# Patient Record
Sex: Female | Born: 1975 | Race: White | Hispanic: No | Marital: Single | Smoking: Current every day smoker
Health system: Southern US, Community
[De-identification: ages and names within clinical notes are randomized; demographics above are authoritative.]

## PROBLEM LIST (undated history)

## (undated) DIAGNOSIS — M5020 Other cervical disc displacement, unspecified cervical region: Secondary | ICD-10-CM

## (undated) DIAGNOSIS — F1210 Cannabis abuse, uncomplicated: Secondary | ICD-10-CM

## (undated) DIAGNOSIS — F1920 Other psychoactive substance dependence, uncomplicated: Secondary | ICD-10-CM

## (undated) DIAGNOSIS — F1110 Opioid abuse, uncomplicated: Secondary | ICD-10-CM

## (undated) DIAGNOSIS — N809 Endometriosis, unspecified: Secondary | ICD-10-CM

## (undated) HISTORY — PX: TONSILLECTOMY: SUR1361

## (undated) HISTORY — PX: APPENDECTOMY: SHX54

---

## 2000-09-16 ENCOUNTER — Emergency Department (HOSPITAL_COMMUNITY): Admission: EM | Admit: 2000-09-16 | Discharge: 2000-09-16 | Payer: Self-pay | Admitting: Emergency Medicine

## 2001-02-17 ENCOUNTER — Emergency Department (HOSPITAL_COMMUNITY): Admission: EM | Admit: 2001-02-17 | Discharge: 2001-02-17 | Payer: Self-pay | Admitting: Emergency Medicine

## 2001-04-17 ENCOUNTER — Other Ambulatory Visit: Admission: RE | Admit: 2001-04-17 | Discharge: 2001-04-17 | Payer: Self-pay | Admitting: *Deleted

## 2002-10-01 ENCOUNTER — Inpatient Hospital Stay (HOSPITAL_COMMUNITY): Admission: AD | Admit: 2002-10-01 | Discharge: 2002-10-01 | Payer: Self-pay | Admitting: *Deleted

## 2002-10-02 ENCOUNTER — Ambulatory Visit (HOSPITAL_COMMUNITY): Admission: RE | Admit: 2002-10-02 | Discharge: 2002-10-02 | Payer: Self-pay | Admitting: *Deleted

## 2002-10-02 ENCOUNTER — Encounter: Payer: Self-pay | Admitting: *Deleted

## 2002-10-12 ENCOUNTER — Encounter: Payer: Self-pay | Admitting: *Deleted

## 2002-10-12 ENCOUNTER — Inpatient Hospital Stay (HOSPITAL_COMMUNITY): Admission: RE | Admit: 2002-10-12 | Discharge: 2002-10-12 | Payer: Self-pay | Admitting: *Deleted

## 2002-12-30 ENCOUNTER — Other Ambulatory Visit: Admission: RE | Admit: 2002-12-30 | Discharge: 2002-12-30 | Payer: Self-pay | Admitting: Obstetrics and Gynecology

## 2003-05-27 ENCOUNTER — Inpatient Hospital Stay (HOSPITAL_COMMUNITY): Admission: AD | Admit: 2003-05-27 | Discharge: 2003-05-30 | Payer: Self-pay | Admitting: Obstetrics and Gynecology

## 2003-07-07 ENCOUNTER — Other Ambulatory Visit: Admission: RE | Admit: 2003-07-07 | Discharge: 2003-07-07 | Payer: Self-pay | Admitting: Obstetrics and Gynecology

## 2003-10-05 ENCOUNTER — Emergency Department (HOSPITAL_COMMUNITY): Admission: EM | Admit: 2003-10-05 | Discharge: 2003-10-05 | Payer: Self-pay | Admitting: *Deleted

## 2003-11-09 ENCOUNTER — Emergency Department (HOSPITAL_COMMUNITY): Admission: EM | Admit: 2003-11-09 | Discharge: 2003-11-09 | Payer: Self-pay | Admitting: Emergency Medicine

## 2003-12-07 ENCOUNTER — Encounter: Admission: RE | Admit: 2003-12-07 | Discharge: 2003-12-07 | Payer: Self-pay | Admitting: Family Medicine

## 2003-12-13 ENCOUNTER — Observation Stay (HOSPITAL_COMMUNITY): Admission: EM | Admit: 2003-12-13 | Discharge: 2003-12-14 | Payer: Self-pay | Admitting: Emergency Medicine

## 2003-12-13 ENCOUNTER — Encounter: Admission: RE | Admit: 2003-12-13 | Discharge: 2003-12-13 | Payer: Self-pay | Admitting: Sports Medicine

## 2003-12-13 ENCOUNTER — Encounter: Admission: RE | Admit: 2003-12-13 | Discharge: 2003-12-13 | Payer: Self-pay | Admitting: Family Medicine

## 2004-02-08 ENCOUNTER — Encounter: Admission: RE | Admit: 2004-02-08 | Discharge: 2004-02-08 | Payer: Self-pay | Admitting: Family Medicine

## 2004-03-29 ENCOUNTER — Ambulatory Visit (HOSPITAL_COMMUNITY): Admission: RE | Admit: 2004-03-29 | Discharge: 2004-03-29 | Payer: Self-pay | Admitting: Obstetrics and Gynecology

## 2004-04-18 ENCOUNTER — Encounter: Admission: RE | Admit: 2004-04-18 | Discharge: 2004-04-18 | Payer: Self-pay | Admitting: Family Medicine

## 2004-05-04 ENCOUNTER — Encounter: Admission: RE | Admit: 2004-05-04 | Discharge: 2004-05-04 | Payer: Self-pay | Admitting: Family Medicine

## 2004-05-05 ENCOUNTER — Inpatient Hospital Stay (HOSPITAL_COMMUNITY): Admission: AD | Admit: 2004-05-05 | Discharge: 2004-05-05 | Payer: Self-pay | Admitting: Obstetrics and Gynecology

## 2004-06-13 ENCOUNTER — Encounter: Admission: RE | Admit: 2004-06-13 | Discharge: 2004-06-13 | Payer: Self-pay | Admitting: Family Medicine

## 2004-06-27 ENCOUNTER — Encounter: Admission: RE | Admit: 2004-06-27 | Discharge: 2004-06-27 | Payer: Self-pay | Admitting: Family Medicine

## 2004-07-04 ENCOUNTER — Emergency Department (HOSPITAL_COMMUNITY): Admission: EM | Admit: 2004-07-04 | Discharge: 2004-07-05 | Payer: Self-pay | Admitting: Emergency Medicine

## 2004-07-08 ENCOUNTER — Inpatient Hospital Stay (HOSPITAL_COMMUNITY): Admission: AD | Admit: 2004-07-08 | Discharge: 2004-07-08 | Payer: Self-pay | Admitting: Gynecology

## 2004-07-20 ENCOUNTER — Encounter: Admission: RE | Admit: 2004-07-20 | Discharge: 2004-07-20 | Payer: Self-pay | Admitting: Family Medicine

## 2004-07-25 ENCOUNTER — Encounter: Admission: RE | Admit: 2004-07-25 | Discharge: 2004-07-25 | Payer: Self-pay | Admitting: Family Medicine

## 2007-02-27 DIAGNOSIS — F411 Generalized anxiety disorder: Secondary | ICD-10-CM | POA: Insufficient documentation

## 2007-02-27 DIAGNOSIS — F339 Major depressive disorder, recurrent, unspecified: Secondary | ICD-10-CM | POA: Insufficient documentation

## 2016-02-15 ENCOUNTER — Other Ambulatory Visit: Payer: Self-pay | Admitting: Specialist

## 2016-02-15 DIAGNOSIS — M5412 Radiculopathy, cervical region: Secondary | ICD-10-CM

## 2016-02-23 ENCOUNTER — Ambulatory Visit
Admission: RE | Admit: 2016-02-23 | Discharge: 2016-02-23 | Disposition: A | Discharge status: Home / Self Care | Payer: Medicaid Other | Source: Ambulatory Visit | Attending: Specialist | Admitting: Specialist

## 2016-02-23 DIAGNOSIS — M5412 Radiculopathy, cervical region: Secondary | ICD-10-CM

## 2016-09-26 ENCOUNTER — Emergency Department (HOSPITAL_BASED_OUTPATIENT_CLINIC_OR_DEPARTMENT_OTHER)
Admission: EM | Admit: 2016-09-26 | Discharge: 2016-09-26 | Disposition: A | Discharge status: Home / Self Care | Payer: Medicaid Other | Attending: Emergency Medicine | Admitting: Emergency Medicine

## 2016-09-26 ENCOUNTER — Emergency Department (HOSPITAL_BASED_OUTPATIENT_CLINIC_OR_DEPARTMENT_OTHER): Payer: Medicaid Other

## 2016-09-26 ENCOUNTER — Encounter (HOSPITAL_BASED_OUTPATIENT_CLINIC_OR_DEPARTMENT_OTHER): Payer: Self-pay | Admitting: Emergency Medicine

## 2016-09-26 DIAGNOSIS — F513 Sleepwalking [somnambulism]: Secondary | ICD-10-CM | POA: Insufficient documentation

## 2016-09-26 DIAGNOSIS — Z79899 Other long term (current) drug therapy: Secondary | ICD-10-CM | POA: Diagnosis not present

## 2016-09-26 DIAGNOSIS — Y92002 Bathroom of unspecified non-institutional (private) residence single-family (private) house as the place of occurrence of the external cause: Secondary | ICD-10-CM | POA: Diagnosis not present

## 2016-09-26 DIAGNOSIS — W19XXXA Unspecified fall, initial encounter: Secondary | ICD-10-CM

## 2016-09-26 DIAGNOSIS — S0990XA Unspecified injury of head, initial encounter: Secondary | ICD-10-CM | POA: Diagnosis present

## 2016-09-26 DIAGNOSIS — W1839XA Other fall on same level, initial encounter: Secondary | ICD-10-CM | POA: Diagnosis not present

## 2016-09-26 DIAGNOSIS — S01111A Laceration without foreign body of right eyelid and periocular area, initial encounter: Secondary | ICD-10-CM | POA: Insufficient documentation

## 2016-09-26 DIAGNOSIS — F17210 Nicotine dependence, cigarettes, uncomplicated: Secondary | ICD-10-CM | POA: Diagnosis not present

## 2016-09-26 DIAGNOSIS — Y939 Activity, unspecified: Secondary | ICD-10-CM | POA: Insufficient documentation

## 2016-09-26 DIAGNOSIS — Y999 Unspecified external cause status: Secondary | ICD-10-CM | POA: Insufficient documentation

## 2016-09-26 DIAGNOSIS — S0181XA Laceration without foreign body of other part of head, initial encounter: Secondary | ICD-10-CM

## 2016-09-26 HISTORY — DX: Other cervical disc displacement, unspecified cervical region: M50.20

## 2016-09-26 HISTORY — DX: Endometriosis, unspecified: N80.9

## 2016-09-26 LAB — PREGNANCY, URINE: Preg Test, Ur: NEGATIVE

## 2016-09-26 NOTE — ED Provider Notes (Addendum)
[facility] Provider Note   CSN: [id number] Arrival date & time: 09/26/16  0932     History   Chief Complaint Chief Complaint  Patient presents with  . Laceration    HPI Martha Gomez is a 40 y.o. female.  HPI  Patient presents after mechanical fall earlier this morning. She reports that she has been having trouble with sleep walking for the past several weeks to months and is recently started on new medication, Topamax and amitriptyline. Since then Topamax has been discontinued however she continues to have sleepwalking issues. Patient also noted to be on Soma, Lexapro, and Phenergan. She reports that she awoke around 3:00 while sitting on the toilet. After trying to get up she noted that her legs had fallen asleep and had trouble maintaining her stance causing her to fall forward and hitting her head on the floor. She denies LOC or amnesia to the event. She noted bleeding from above the right eyebrow. She decided to wait to see if she had any complications. She did not sleep after the incident. Since then the bleeding has resolved. She has not had any trouble walking or is having any focal deficits since. She does complain of headache. Otherwise no complaints.  Past Medical History:  Diagnosis Date  . Endometriosis   . Herniated disc, cervical     Patient Active Problem List   Diagnosis Date Noted  . DEPRESSION, MAJOR, RECURRENT 02/27/2007  . ANXIETY 02/27/2007    Past Surgical History:  Procedure Laterality Date  . APPENDECTOMY    . TONSILLECTOMY      OB History    No data available       Home Medications    Prior to Admission medications   Medication Sig Start Date End Date Taking? Authorizing Provider  carisoprodol (SOMA) 350 MG tablet Take 350 mg by mouth 3 (three) times daily.   Yes Historical Provider, MD  Coenzyme Q10 (CO Q-10) 200 MG CAPS Take 1 tablet by mouth daily.   Yes Historical Provider, MD  escitalopram (LEXAPRO) 10 MG tablet Take  10 mg by mouth daily.   Yes Historical Provider, MD  gabapentin (NEURONTIN) 300 MG capsule Take 300 mg by mouth 3 (three) times daily.   Yes Historical Provider, MD  linaclotide (LINZESS) 290 MCG CAPS capsule Take 290 mcg by mouth daily before breakfast.   Yes Historical Provider, MD  Magnesium 400 MG CAPS Take 400 mg by mouth daily.   Yes Historical Provider, MD  Multiple Vitamin (MULTIVITAMIN) tablet Take 1 tablet by mouth daily.   Yes Historical Provider, MD  promethazine (PHENERGAN) 25 MG tablet Take 25 mg by mouth every 6 (six) hours as needed for nausea or vomiting.   Yes Historical Provider, MD  RABEprazole (ACIPHEX) 20 MG tablet Take 20 mg by mouth 2 (two) times daily.   Yes Historical Provider, MD  vitamin C (ASCORBIC ACID) 500 MG tablet Take 500 mg by mouth daily.   Yes Historical Provider, MD    Family History No family history on file.  Social History Social History  Substance Use Topics  . Smoking status: Current Every Day Smoker    Packs/day: 1.00    Types: Cigarettes  . Smokeless tobacco: Never Used  . Alcohol use No     Allergies   Celebrex [celecoxib]; Cymbalta [duloxetine hcl]; Erythromycin; Penicillins; and Sulfa antibiotics   Review of Systems Review of Systems  Constitutional: Negative for chills and fever.  HENT: Negative for ear pain and  sore throat.   Eyes: Negative for pain and visual disturbance.  Respiratory: Negative for cough and shortness of breath.   Cardiovascular: Negative for chest pain and palpitations.  Gastrointestinal: Negative for abdominal pain and vomiting.  Genitourinary: Negative for dysuria and hematuria.  Musculoskeletal: Negative for arthralgias and back pain.  Skin: Positive for wound. Negative for color change and rash.  Neurological: Positive for headaches. Negative for seizures and syncope.  All other systems reviewed and are negative.    Physical Exam Updated Vital Signs BP 135/84 (BP Location: Right Arm)   Pulse 70    Temp 98.4 F (36.9 C) (Oral)   Resp 18   Ht 5\' 6"  (1.676 m)   Wt 155 lb (70.3 kg)   LMP 08/22/2016   SpO2 96%   BMI 25.02 kg/m   Physical Exam  Constitutional: She is oriented to person, place, and time. She appears well-developed and well-nourished. No distress.  HENT:  Head: Normocephalic. Head is with laceration (1 cm superficial, over right eyebrow).  Right Ear: External ear normal.  Left Ear: External ear normal.  Nose: Nose normal.  Eyes: Conjunctivae and EOM are normal. Pupils are equal, round, and reactive to light. Right eye exhibits no discharge. Left eye exhibits no discharge. No scleral icterus.  Neck: Normal range of motion. Neck supple. Spinous process tenderness (about C6) present.    Cardiovascular: Normal rate, regular rhythm and normal heart sounds.  Exam reveals no gallop and no friction rub.   No murmur heard. Pulses:      Radial pulses are 2+ on the right side, and 2+ on the left side.       Dorsalis pedis pulses are 2+ on the right side, and 2+ on the left side.  Pulmonary/Chest: Effort normal and breath sounds normal. No stridor. No respiratory distress. She has no wheezes. She has no rales.  Abdominal: Soft. She exhibits no distension. There is no tenderness.  Musculoskeletal: She exhibits no edema or tenderness.       Cervical back: She exhibits no bony tenderness.       Thoracic back: She exhibits no bony tenderness.       Lumbar back: She exhibits no bony tenderness.  Clavicles stable. Chest stable to AP/Lat compression. Pelvis stable to Lat compression. No obvious extremity deformity. No seat belt sign.  Neurological: She is alert and oriented to person, place, and time.  Mental Status: Alert and oriented to person, place, and time. Attention and concentration normal. Speech clear. Recent memory is intac  Cranial Nerves  II Visual Fields: Intact to confrontation. Visual fields intact. III, IV, VI: Pupils equal and reactive to light and near. Full  eye movement without nystagmus  V Facial Sensation: Normal. No weakness of masticatory muscles  VII: No facial weakness or asymmetry  VIII Auditory Acuity: Grossly normal  IX/X: The uvula is midline; the palate elevates symmetrically  XI: Normal sternocleidomastoid and trapezius strength  XII: The tongue is midline. No atrophy or fasciculations.   Motor System: Muscle Strength: 5/5 and symmetric in the upper and lower extremities. No pronation or drift.  Muscle Tone: Tone and muscle bulk are normal in the upper and lower extremities.   Reflexes: DTRs: 2+ and symmetrical in all four extremities. Plantar responses are flexor bilaterally.  Coordination: Intact finger-to-nose, heel-to-shin. No tremor.  Sensation: Intact to light touch, and pinprick.  Gait: Routine and tandem gait are normal    Skin: Skin is warm and dry. No rash noted. She is  not diaphoretic. No erythema.  Psychiatric: She has a normal mood and affect.  Vitals reviewed.    ED Treatments / Results  Labs (all labs ordered are listed, but only abnormal results are displayed) Labs Reviewed  PREGNANCY, URINE    EKG  EKG Interpretation  Date/Time:  Wednesday September 26 2016 10:31:28 EDT Ventricular Rate:  68 PR Interval:    QRS Duration: 99 QT Interval:  416 QTC Calculation: 443 R Axis:   34 Text Interpretation:  Sinus or ectopic atrial rhythm Short PR interval Low voltage, extremity and precordial leads no prior tracing for comparison Confirmed by [facility] MD, Abbagale Goguen ([id number]) on 09/26/2016 11:21:42 AM       Radiology Ct Head Wo Contrast  Result Date: 09/26/2016 CLINICAL DATA:  Trauma. Laceration to right eyebrow region. Right temporal bruising. EXAM: CT HEAD WITHOUT CONTRAST CT CERVICAL SPINE WITHOUT CONTRAST TECHNIQUE: Multidetector CT imaging of the head and cervical spine was performed following the standard protocol without intravenous contrast. Multiplanar CT image reconstructions of the cervical spine were  also generated. COMPARISON:  Cervical spine MRI of 02/23/2016 FINDINGS: CT HEAD FINDINGS Brain: No mass lesion, hemorrhage, hydrocephalus, acute infarct, intra-axial, or extra-axial fluid collection. Vascular: No hyperdense vessel or unexpected calcification. Skull: Soft tissue injury superior and lateral to the right orbit, including on image 11/series 3. Soft tissue swelling extends minimally into the right temporal region. No other scalp soft tissue swelling. No skull fracture. Sinuses/Orbits: Normal orbits and globes. Clear paranasal sinuses and mastoid air cells. Other: None CT CERVICAL SPINE FINDINGS Alignment: Spinal visualization through the bottom of T1. Maintenance of vertebral body height. Straightening of expected cervical lordosis. Facets are well-aligned. Skull base and vertebrae: Skull base intact. No acute fracture. Coronal reformats demonstrate a normal C1-C2 articulation. Soft tissues and spinal canal: Prevertebral soft tissues are within normal limits. No epidural hematoma. Disc levels: No significant neural foraminal narrowing or central canal stenosis. Upper chest: No apical pneumothorax. Other: None IMPRESSION: 1. Right supraorbital and temporal scalp soft tissue swelling, without acute intracranial abnormality. 2. No acute fracture or subluxation in the cervical spine. Straightening of expected cervical lordosis could be positional, due to muscular spasm, or ligamentous injury. Electronically Signed   By: Jeronimo Greaves M.D.   On: 09/26/2016 10:58   Ct Cervical Spine Wo Contrast  Result Date: 09/26/2016 CLINICAL DATA:  Trauma. Laceration to right eyebrow region. Right temporal bruising. EXAM: CT HEAD WITHOUT CONTRAST CT CERVICAL SPINE WITHOUT CONTRAST TECHNIQUE: Multidetector CT imaging of the head and cervical spine was performed following the standard protocol without intravenous contrast. Multiplanar CT image reconstructions of the cervical spine were also generated. COMPARISON:  Cervical  spine MRI of 02/23/2016 FINDINGS: CT HEAD FINDINGS Brain: No mass lesion, hemorrhage, hydrocephalus, acute infarct, intra-axial, or extra-axial fluid collection. Vascular: No hyperdense vessel or unexpected calcification. Skull: Soft tissue injury superior and lateral to the right orbit, including on image 11/series 3. Soft tissue swelling extends minimally into the right temporal region. No other scalp soft tissue swelling. No skull fracture. Sinuses/Orbits: Normal orbits and globes. Clear paranasal sinuses and mastoid air cells. Other: None CT CERVICAL SPINE FINDINGS Alignment: Spinal visualization through the bottom of T1. Maintenance of vertebral body height. Straightening of expected cervical lordosis. Facets are well-aligned. Skull base and vertebrae: Skull base intact. No acute fracture. Coronal reformats demonstrate a normal C1-C2 articulation. Soft tissues and spinal canal: Prevertebral soft tissues are within normal limits. No epidural hematoma. Disc levels: No significant neural foraminal narrowing or central canal  stenosis. Upper chest: No apical pneumothorax. Other: None IMPRESSION: 1. Right supraorbital and temporal scalp soft tissue swelling, without acute intracranial abnormality. 2. No acute fracture or subluxation in the cervical spine. Straightening of expected cervical lordosis could be positional, due to muscular spasm, or ligamentous injury. Electronically Signed   By: Jeronimo Greaves M.D.   On: 09/26/2016 10:58    Procedures Procedures (including critical care time)  Medications Ordered in ED Medications - No data to display   Initial Impression / Assessment and Plan / ED Course  I have reviewed the triage vital signs and the nursing notes.  Pertinent labs & imaging results that were available during my care of the patient were reviewed by me and considered in my medical decision making (see chart for details).  Clinical Course    Unable to clear his spine using Nexus criteria.  Obtain CT head and cervical spine which were unremarkable. Obtained EKG and given reported a regimen of amitriptyline. EKG without evidence of prolonged QT or QRS. Did note ectopic atrial rhythm. Otherwise no arrhythmias noted. Laceration is superficial requiring no stitches. Wound was irrigated and Steri-Strip applied.  Patient was instructed to follow-up with her neurologist in giving her persistent sleepwalking.  The patient is safe for discharge with strict return precautions.   Final Clinical Impressions(s) / ED Diagnoses   Final diagnoses:  Facial laceration, initial encounter  Fall, initial encounter  Head injury, initial encounter  Sleep walking   Disposition: Discharge  Condition: Good  I have discussed the results, Dx and Tx plan with the patient who expressed understanding and agree(s) with the plan. Discharge instructions discussed at great length. The patient was given strict return precautions who verbalized understanding of the instructions. No further questions at time of discharge.    Current Discharge Medication List      Follow Up: Neurologist  Schedule an appointment as soon as possible for a visit  as soon as possible for evaluation of your sleepwalking      Nira Conn, MD 09/26/16 1122

## 2016-09-26 NOTE — ED Notes (Signed)
Wound irrigated with NS and dried with 4x4 and steri strips applied. Tolerated well.

## 2016-09-26 NOTE — ED Triage Notes (Signed)
Pt reports waking up on toilet (she does not remember getting up to use BR) about 3am; when she stood up her legs were asleep and she fell, hitting head on tile floor.

## 2017-01-30 ENCOUNTER — Emergency Department (HOSPITAL_BASED_OUTPATIENT_CLINIC_OR_DEPARTMENT_OTHER): Payer: Medicaid Other

## 2017-01-30 ENCOUNTER — Encounter (HOSPITAL_BASED_OUTPATIENT_CLINIC_OR_DEPARTMENT_OTHER): Payer: Self-pay

## 2017-01-30 ENCOUNTER — Emergency Department (HOSPITAL_BASED_OUTPATIENT_CLINIC_OR_DEPARTMENT_OTHER)
Admission: EM | Admit: 2017-01-30 | Discharge: 2017-01-30 | Disposition: A | Discharge status: Home / Self Care | Payer: Medicaid Other | Attending: Emergency Medicine | Admitting: Emergency Medicine

## 2017-01-30 DIAGNOSIS — Y999 Unspecified external cause status: Secondary | ICD-10-CM | POA: Diagnosis not present

## 2017-01-30 DIAGNOSIS — Y92410 Unspecified street and highway as the place of occurrence of the external cause: Secondary | ICD-10-CM | POA: Insufficient documentation

## 2017-01-30 DIAGNOSIS — Z5181 Encounter for therapeutic drug level monitoring: Secondary | ICD-10-CM | POA: Insufficient documentation

## 2017-01-30 DIAGNOSIS — Y939 Activity, unspecified: Secondary | ICD-10-CM | POA: Diagnosis not present

## 2017-01-30 DIAGNOSIS — S7002XA Contusion of left hip, initial encounter: Secondary | ICD-10-CM | POA: Diagnosis not present

## 2017-01-30 DIAGNOSIS — R109 Unspecified abdominal pain: Secondary | ICD-10-CM | POA: Insufficient documentation

## 2017-01-30 DIAGNOSIS — F1910 Other psychoactive substance abuse, uncomplicated: Secondary | ICD-10-CM | POA: Insufficient documentation

## 2017-01-30 DIAGNOSIS — F17210 Nicotine dependence, cigarettes, uncomplicated: Secondary | ICD-10-CM | POA: Diagnosis not present

## 2017-01-30 DIAGNOSIS — R079 Chest pain, unspecified: Secondary | ICD-10-CM | POA: Diagnosis not present

## 2017-01-30 DIAGNOSIS — S60511A Abrasion of right hand, initial encounter: Secondary | ICD-10-CM | POA: Diagnosis not present

## 2017-01-30 DIAGNOSIS — S60512A Abrasion of left hand, initial encounter: Secondary | ICD-10-CM | POA: Diagnosis not present

## 2017-01-30 DIAGNOSIS — S0181XA Laceration without foreign body of other part of head, initial encounter: Secondary | ICD-10-CM | POA: Diagnosis not present

## 2017-01-30 DIAGNOSIS — S0990XA Unspecified injury of head, initial encounter: Secondary | ICD-10-CM | POA: Diagnosis present

## 2017-01-30 DIAGNOSIS — W19XXXA Unspecified fall, initial encounter: Secondary | ICD-10-CM

## 2017-01-30 DIAGNOSIS — N390 Urinary tract infection, site not specified: Secondary | ICD-10-CM | POA: Insufficient documentation

## 2017-01-30 DIAGNOSIS — S0083XA Contusion of other part of head, initial encounter: Secondary | ICD-10-CM

## 2017-01-30 HISTORY — DX: Opioid abuse, uncomplicated: F11.10

## 2017-01-30 HISTORY — DX: Other psychoactive substance dependence, uncomplicated: F19.20

## 2017-01-30 HISTORY — DX: Cannabis abuse, uncomplicated: F12.10

## 2017-01-30 LAB — COMPREHENSIVE METABOLIC PANEL
ALT: 19 U/L (ref 14–54)
ANION GAP: 7 (ref 5–15)
AST: 43 U/L — ABNORMAL HIGH (ref 15–41)
Albumin: 3.4 g/dL — ABNORMAL LOW (ref 3.5–5.0)
Alkaline Phosphatase: 104 U/L (ref 38–126)
BUN: 8 mg/dL (ref 6–20)
CHLORIDE: 106 mmol/L (ref 101–111)
CO2: 26 mmol/L (ref 22–32)
Calcium: 8.7 mg/dL — ABNORMAL LOW (ref 8.9–10.3)
Creatinine, Ser: 0.55 mg/dL (ref 0.44–1.00)
GFR calc non Af Amer: >60 mL/min (ref >60)
Glucose, Bld: 89 mg/dL (ref 65–99)
Potassium: 3.8 mmol/L (ref 3.5–5.1)
SODIUM: 139 mmol/L (ref 135–145)
Total Bilirubin: 0.7 mg/dL (ref 0.3–1.2)
Total Protein: 6.3 g/dL — ABNORMAL LOW (ref 6.5–8.1)

## 2017-01-30 LAB — CBC WITH DIFFERENTIAL/PLATELET
BASOS PCT: 0 %
Basophils Absolute: 0 10*3/uL (ref 0.0–0.1)
EOS ABS: 0 10*3/uL (ref 0.0–0.7)
EOS PCT: 0 %
HCT: 37.8 % (ref 36.0–46.0)
Hemoglobin: 13 g/dL (ref 12.0–15.0)
LYMPHS ABS: 1.1 10*3/uL (ref 0.7–4.0)
Lymphocytes Relative: 8 %
MCH: 33.6 pg (ref 26.0–34.0)
MCHC: 34.4 g/dL (ref 30.0–36.0)
MCV: 97.7 fL (ref 78.0–100.0)
Monocytes Absolute: 0.9 10*3/uL (ref 0.1–1.0)
Monocytes Relative: 7 %
Neutro Abs: 11.8 10*3/uL — ABNORMAL HIGH (ref 1.7–7.7)
Neutrophils Relative %: 85 %
PLATELETS: 378 10*3/uL (ref 150–400)
RBC: 3.87 MIL/uL (ref 3.87–5.11)
RDW: 11.2 % — ABNORMAL LOW (ref 11.5–15.5)
WBC: 13.8 10*3/uL — AB (ref 4.0–10.5)

## 2017-01-30 LAB — URINALYSIS, ROUTINE W REFLEX MICROSCOPIC
Bilirubin Urine: NEGATIVE
GLUCOSE, UA: NEGATIVE mg/dL
Ketones, ur: 40 mg/dL — AB
Nitrite: NEGATIVE
PH: 6 (ref 5.0–8.0)
Protein, ur: NEGATIVE mg/dL
Specific Gravity, Urine: 1.017 (ref 1.005–1.030)

## 2017-01-30 LAB — RAPID URINE DRUG SCREEN, HOSP PERFORMED
Amphetamines: NOT DETECTED
Barbiturates: NOT DETECTED
Benzodiazepines: POSITIVE — AB
COCAINE: NOT DETECTED
OPIATES: POSITIVE — AB
Tetrahydrocannabinol: POSITIVE — AB

## 2017-01-30 LAB — URINALYSIS, MICROSCOPIC (REFLEX)

## 2017-01-30 LAB — SALICYLATE LEVEL: Salicylate Lvl: <7 mg/dL (ref 2.8–30.0)

## 2017-01-30 LAB — CK: Total CK: 786 U/L — ABNORMAL HIGH (ref 38–234)

## 2017-01-30 LAB — PREGNANCY, URINE: Preg Test, Ur: NEGATIVE

## 2017-01-30 LAB — ETHANOL

## 2017-01-30 LAB — ACETAMINOPHEN LEVEL

## 2017-01-30 MED ORDER — LIDOCAINE HCL (PF) 1 % IJ SOLN
5.0000 mL | Freq: Once | INTRAMUSCULAR | Status: AC
  Administered 2017-01-30: 5 mL
  Filled 2017-01-30: qty 5

## 2017-01-30 MED ORDER — SODIUM CHLORIDE 0.9 % IV BOLUS (SEPSIS)
1000.0000 mL | Freq: Once | INTRAVENOUS | Status: AC
  Administered 2017-01-30: 1000 mL via INTRAVENOUS

## 2017-01-30 MED ORDER — CEPHALEXIN 500 MG PO CAPS
500.0000 mg | ORAL_CAPSULE | Freq: Four times a day (QID) | ORAL | 0 refills | Status: AC
Start: 2017-01-30 — End: ?

## 2017-01-30 MED ORDER — IOPAMIDOL (ISOVUE-300) INJECTION 61%
100.0000 mL | Freq: Once | INTRAVENOUS | Status: AC | PRN
Start: 2017-01-30 — End: 2017-01-30
  Administered 2017-01-30: 100 mL via INTRAVENOUS

## 2017-01-30 MED FILL — CEPHALEXIN 500 MG CAPSULE: 500 | 5 days supply | Qty: 20 | Fill #0

## 2017-01-30 MED FILL — IBUPROFEN 800 MG TABLET: 800 | 5 days supply | Qty: 15 | Fill #0

## 2017-01-30 NOTE — ED Notes (Signed)
Pt taking po fluids well.  Ambulated to bathroom with standby assist.  Pt tolerated well.

## 2017-01-30 NOTE — ED Triage Notes (Signed)
[facility] with no ride home so she has been walking, states has tripped multiple times, walked here with active bleeding to chin and lip and rt knee.

## 2017-01-30 NOTE — Discharge Instructions (Addendum)
It was our pleasure to provide your ER care today - we hope that you feel better.  Do not use drugs.  See resource guide provided for community resources you can use to help.  Follow up in 1 week for suture removal with your doctor or urgent care.   Take antibiotic as prescribed for possible urine infection.  Return to the ED if you develop new or worsening symptoms.
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
## 2017-02-05 ENCOUNTER — Emergency Department (HOSPITAL_BASED_OUTPATIENT_CLINIC_OR_DEPARTMENT_OTHER)
Admission: EM | Admit: 2017-02-05 | Discharge: 2017-02-05 | Discharge status: Against Medical Advice | Payer: Medicaid Other | Attending: Emergency Medicine | Admitting: Emergency Medicine

## 2017-02-05 ENCOUNTER — Encounter (HOSPITAL_BASED_OUTPATIENT_CLINIC_OR_DEPARTMENT_OTHER): Payer: Self-pay

## 2017-02-05 DIAGNOSIS — F17210 Nicotine dependence, cigarettes, uncomplicated: Secondary | ICD-10-CM | POA: Diagnosis not present

## 2017-02-05 DIAGNOSIS — Z4802 Encounter for removal of sutures: Secondary | ICD-10-CM | POA: Insufficient documentation

## 2017-02-05 DIAGNOSIS — Z79899 Other long term (current) drug therapy: Secondary | ICD-10-CM | POA: Insufficient documentation

## 2017-02-05 NOTE — ED Notes (Signed)
Pt left prior to seeing provider

## 2017-02-05 NOTE — ED Notes (Signed)
Pt's mother has asked how much longer it will be twice now; sts they are "antsy" because they have an appt they need to make.  3 sutures removed from chin; wound well-healed and well-approximated.

## 2017-02-05 NOTE — ED Provider Notes (Signed)
[facility]GENCY DEPT [facility] Provider Note   CSN: [id number] Arrival date & time: 02/05/17  1132     History   Chief Complaint Chief Complaint  Patient presents with  . Suture / Staple Removal    HPI Martha Gomez is a 41 y.o. female.  HPI   41 year old female presenting to the ED today for removal of stitches. Pt injured his chin on 1/31, and had 3 sutures to chin.  It has been healing appropriately.  No complaint.  Past Medical History:  Diagnosis Date  . Drug abuse and dependence (HCC)   . Endometriosis   . Herniated disc, cervical   . Marijuana abuse   . Opiate abuse, continuous     Patient Active Problem List   Diagnosis Date Noted  . DEPRESSION, MAJOR, RECURRENT 02/27/2007  . ANXIETY 02/27/2007    Past Surgical History:  Procedure Laterality Date  . APPENDECTOMY    . TONSILLECTOMY      OB History    No data available       Home Medications    Prior to Admission medications   Medication Sig Start Date End Date Taking? Authorizing Provider  carisoprodol (SOMA) 350 MG tablet Take 350 mg by mouth 3 (three) times daily.    Historical Provider, MD  cephALEXin (KEFLEX) 500 MG capsule Take 1 capsule (500 mg total) by mouth 4 (four) times daily. 01/30/17   Cathren LaineKevin Steinl, MD  Coenzyme Q10 (CO Q-10) 200 MG CAPS Take 1 tablet by mouth daily.    Historical Provider, MD  escitalopram (LEXAPRO) 10 MG tablet Take 10 mg by mouth daily.    Historical Provider, MD  gabapentin (NEURONTIN) 300 MG capsule Take 300 mg by mouth 3 (three) times daily.    Historical Provider, MD  linaclotide (LINZESS) 290 MCG CAPS capsule Take 290 mcg by mouth daily before breakfast.    Historical Provider, MD  Magnesium 400 MG CAPS Take 400 mg by mouth daily.    Historical Provider, MD  Multiple Vitamin (MULTIVITAMIN) tablet Take 1 tablet by mouth daily.    Historical Provider, MD  promethazine (PHENERGAN) 25 MG tablet Take 25 mg by mouth every 6 (six) hours as needed for nausea or vomiting.     Historical Provider, MD  RABEprazole (ACIPHEX) 20 MG tablet Take 20 mg by mouth 2 (two) times daily.    Historical Provider, MD  vitamin C (ASCORBIC ACID) 500 MG tablet Take 500 mg by mouth daily.    Historical Provider, MD    Family History No family history on file.  Social History Social History  Substance Use Topics  . Smoking status: Current Every Day Smoker    Packs/day: 1.00    Types: Cigarettes  . Smokeless tobacco: Never Used  . Alcohol use No     Allergies   Celebrex [celecoxib]; Cymbalta [duloxetine hcl]; Erythromycin; Penicillins; and Sulfa antibiotics   Review of Systems Review of Systems  Constitutional: Negative for fever.  Skin: Positive for wound.  Neurological: Negative for numbness.     Physical Exam Updated Vital Signs BP 126/89 (BP Location: Left Arm)   Pulse 91   Temp 98.3 F (36.8 C) (Oral)   Resp 18   Ht 5\' 6"  (1.676 m)   Wt 59.6 kg   SpO2 98%   BMI 21.19 kg/m   Physical Exam  Constitutional: She appears well-developed and well-nourished. No distress.  HENT:  Head: Atraumatic.  Eyes: Conjunctivae are normal.  Neck: Neck supple.  Neurological: She is alert.  Skin: No rash noted.  A well healing lac to chin with 3 sutures in place.  No evidence of infection.   Psychiatric: She has a normal mood and affect.  Nursing note and vitals reviewed.    ED Treatments / Results  Labs (all labs ordered are listed, but only abnormal results are displayed) Labs Reviewed - No data to display  EKG  EKG Interpretation None       Radiology No results found.  Procedures Procedures (including critical care time)  SUTURE REMOVAL Performed by: Fayrene Helper  Consent: Verbal consent obtained. Patient identity confirmed: provided demographic data Time out: Immediately prior to procedure a "time out" was called to verify the correct patient, procedure, equipment, support staff and site/side marked as required.  Location details:  chin  Wound Appearance: clean  Sutures/Staples Removed: 3 sutures removed by our nurse.  Facility: sutures placed in this facility Patient tolerance: Patient tolerated the procedure well with no immediate complications.     Medications Ordered in ED Medications - No data to display   Initial Impression / Assessment and Plan / ED Course  I have reviewed the triage vital signs and the nursing notes.  Pertinent labs & imaging results that were available during my care of the patient were reviewed by me and considered in my medical decision making (see chart for details).     BP 126/89 (BP Location: Left Arm)   Pulse 91   Temp 98.3 F (36.8 C) (Oral)   Resp 18   Ht 5\' 6"  (1.676 m)   Wt 59.6 kg   SpO2 98%   BMI 21.19 kg/m    Final Clinical Impressions(s) / ED Diagnoses   Final diagnoses:  Visit for suture removal    New Prescriptions New Prescriptions   No medications on file     Fayrene Helper, PA-C 02/05/17 1312    Jerelyn Scott, MD 02/05/17 1322

## 2017-02-05 NOTE — ED Triage Notes (Signed)
For suture removal-sutures to chin 1/31-NAD-steady gait

## 2017-04-23 ENCOUNTER — Encounter (HOSPITAL_BASED_OUTPATIENT_CLINIC_OR_DEPARTMENT_OTHER): Payer: Self-pay | Admitting: Emergency Medicine

## 2017-04-23 ENCOUNTER — Emergency Department (HOSPITAL_BASED_OUTPATIENT_CLINIC_OR_DEPARTMENT_OTHER)
Admission: EM | Admit: 2017-04-23 | Discharge: 2017-04-23 | Disposition: A | Discharge status: Home / Self Care | Payer: Medicaid Other | Attending: Emergency Medicine | Admitting: Emergency Medicine

## 2017-04-23 DIAGNOSIS — Y999 Unspecified external cause status: Secondary | ICD-10-CM | POA: Insufficient documentation

## 2017-04-23 DIAGNOSIS — S01451A Open bite of right cheek and temporomandibular area, initial encounter: Secondary | ICD-10-CM | POA: Diagnosis present

## 2017-04-23 DIAGNOSIS — Z23 Encounter for immunization: Secondary | ICD-10-CM | POA: Diagnosis not present

## 2017-04-23 DIAGNOSIS — Y939 Activity, unspecified: Secondary | ICD-10-CM | POA: Insufficient documentation

## 2017-04-23 DIAGNOSIS — F17210 Nicotine dependence, cigarettes, uncomplicated: Secondary | ICD-10-CM | POA: Diagnosis not present

## 2017-04-23 DIAGNOSIS — S01431A Puncture wound without foreign body of right cheek and temporomandibular area, initial encounter: Secondary | ICD-10-CM | POA: Diagnosis not present

## 2017-04-23 DIAGNOSIS — Z79899 Other long term (current) drug therapy: Secondary | ICD-10-CM | POA: Insufficient documentation

## 2017-04-23 DIAGNOSIS — Y929 Unspecified place or not applicable: Secondary | ICD-10-CM | POA: Insufficient documentation

## 2017-04-23 DIAGNOSIS — W540XXA Bitten by dog, initial encounter: Secondary | ICD-10-CM | POA: Diagnosis not present

## 2017-04-23 MED ORDER — METRONIDAZOLE 500 MG PO TABS: 500.0000 mg | ORAL_TABLET | Freq: Three times a day (TID) | ORAL | 0 refills | Status: AC

## 2017-04-23 MED ORDER — DOXYCYCLINE HYCLATE 100 MG PO CAPS: 100.0000 mg | ORAL_CAPSULE | Freq: Two times a day (BID) | ORAL | 0 refills | Status: DC

## 2017-04-23 MED ORDER — TETANUS-DIPHTH-ACELL PERTUSSIS 5-2.5-18.5 LF-MCG/0.5 IM SUSP
0.5000 mL | Freq: Once | INTRAMUSCULAR | Status: AC
  Administered 2017-04-23: 0.5 mL via INTRAMUSCULAR
  Filled 2017-04-23: qty 0.5

## 2017-04-23 MED ORDER — CEFUROXIME AXETIL 500 MG PO TABS: 500.0000 mg | ORAL_TABLET | Freq: Two times a day (BID) | ORAL | 0 refills | Status: AC

## 2017-04-23 MED ORDER — MUPIROCIN 2 % EX OINT: 1.0000 "application " | TOPICAL_OINTMENT | Freq: Two times a day (BID) | CUTANEOUS | 0 refills | Status: DC

## 2017-04-23 MED ORDER — MUPIROCIN 2 % EX OINT: TOPICAL_OINTMENT | CUTANEOUS | 0 refills | Status: AC

## 2017-04-23 NOTE — ED Provider Notes (Signed)
[facility]ENCY [facility]PT [facility] Provider Note   CSN: [id number] Arrival date & time: 04/23/17  1643   By signing my name below, I, [name], attest that this documentation has been prepared under the direction and in the presence of [facility], PA-C. Electronically signed, [name], ED Scribe. 04/23/17. 5:28 PM.   History   Chief Complaint Chief Complaint  Patient presents with  . Animal Bite   The history is provided by the patient and medical records. No language interpreter was used.    Martha Gomez is a 41 y.o. female with h/o polysubstance abuse transported via private vehicle to the Emergency Department with concern for multiple dog bite wounds to her face. Pt states she startled her great dane while she was asleep. Wounds noted to forehead and R cheek. Bleeding controlled with bandaids. She states she cleaned the wounds out with water. She states the animal belongs to her, vaccinations are UTD. No pain or other injuries or any other complaints at this time. Last known tetanus 2004 or 2005.   Past Medical History:  Diagnosis Date  . Drug abuse and dependence (HCC)   . Endometriosis   . Herniated disc, cervical   . Marijuana abuse   . Opiate abuse, continuous     Patient Active Problem List   Diagnosis Date Noted  . DEPRESSION, MAJOR, RECURRENT 02/27/2007  . ANXIETY 02/27/2007    Past Surgical History:  Procedure Laterality Date  . APPENDECTOMY    . TONSILLECTOMY      OB History    No data available       Home Medications    Prior to Admission medications   Medication Sig Start Date End Date Taking? Authorizing Provider  escitalopram (LEXAPRO) 10 MG tablet Take 10 mg by mouth daily.   Yes Historical Provider, MD  gabapentin (NEURONTIN) 300 MG capsule Take 300 mg by mouth 3 (three) times daily.   Yes Historical Provider, MD  RABEprazole (ACIPHEX) 20 MG tablet Take 20 mg by mouth 2 (two) times daily.   Yes Historical Provider, MD  topiramate (TOPAMAX)  50 MG tablet Take 75 mg by mouth 2 (two) times daily.   Yes Historical Provider, MD  carisoprodol (SOMA) 350 MG tablet Take 350 mg by mouth 3 (three) times daily.    Historical Provider, MD  cefUROXime (CEFTIN) 500 MG tablet Take 1 tablet (500 mg total) by mouth 2 (two) times daily with a meal. 04/23/17   Chase Picket Jahkari Maclin, PA-C  cephALEXin (KEFLEX) 500 MG capsule Take 1 capsule (500 mg total) by mouth 4 (four) times daily. 01/30/17   Cathren Laine, MD  Coenzyme Q10 (CO Q-10) 200 MG CAPS Take 1 tablet by mouth daily.    Historical Provider, MD  linaclotide (LINZESS) 290 MCG CAPS capsule Take 290 mcg by mouth daily before breakfast.    Historical Provider, MD  Magnesium 400 MG CAPS Take 400 mg by mouth daily.    Historical Provider, MD  metroNIDAZOLE (FLAGYL) 500 MG tablet Take 1 tablet (500 mg total) by mouth 3 (three) times daily. 04/23/17   Chase Picket Cherylynn Liszewski, PA-C  Multiple Vitamin (MULTIVITAMIN) tablet Take 1 tablet by mouth daily.    Historical Provider, MD  mupirocin ointment (BACTROBAN) 2 % Apply to affected area twice daily. 04/23/17   Chase Picket Geoffrey Hynes, PA-C  promethazine (PHENERGAN) 25 MG tablet Take 25 mg by mouth every 6 (six) hours as needed for nausea or vomiting.    Historical Provider, MD  vitamin C (ASCORBIC  ACID) 500 MG tablet Take 500 mg by mouth daily.    Historical Provider, MD    Family History No family history on file.  Social History Social History  Substance Use Topics  . Smoking status: Current Every Day Smoker    Packs/day: 1.00    Types: Cigarettes  . Smokeless tobacco: Never Used  . Alcohol use No     Allergies   Celebrex [celecoxib]; Cymbalta [duloxetine hcl]; Erythromycin; Nsaids; Penicillins; and Sulfa antibiotics   Review of Systems Review of Systems  HENT: Negative for facial swelling.   Skin: Positive for wound.  Neurological: Negative for weakness and numbness.     Physical Exam Updated Vital Signs BP 110/83 (BP Location: Left Arm)   Pulse  78   Temp 98.2 F (36.8 C) (Oral)   Resp 16   Ht  (1.676 m)   Wt 143 lb (64.9 kg)   LMP 04/09/2017   SpO2 98%   BMI 23.08 kg/m   Physical Exam  Constitutional: She is oriented to person, place, and time. She appears well-developed and well-nourished. No distress.  HENT:  Head: Normocephalic.  Cardiovascular: Normal rate, regular rhythm and normal heart sounds.   No murmur heard. Pulmonary/Chest: Effort normal and breath sounds normal. No respiratory distress.  Abdominal: Soft. She exhibits no distension. There is no tenderness.  Musculoskeletal: She exhibits no edema.  Neurological: She is alert and oriented to person, place, and time.  Skin: Skin is warm and dry. Abrasion and laceration noted.  2 cm linear abrasion to the forehead and .5 cm puncture to R cheek.  Nursing note and vitals reviewed.    ED Treatments / Results  DIAGNOSTIC STUDIES: Oxygen Saturation is 98% on RA, NL by my interpretation.    COORDINATION OF CARE: 5:25 PM-Discussed next steps with pt. Pt verbalized understanding and is agreeable with the plan. Will order abx and prepare for d/c. Pt prepared for d/c, advised of symptomatic care at home and return precautions.   Labs (all labs ordered are listed, but only abnormal results are displayed) Labs Reviewed - No data to display  EKG  EKG Interpretation None       Radiology No results found.  Procedures Procedures (including critical care time)  Medications Ordered in ED Medications  Tdap (BOOSTRIX) injection 0.5 mL (0.5 mLs Intramuscular Given 04/23/17 1743)     Initial Impression / Assessment and Plan / ED Course  I have reviewed the triage vital signs and the nursing notes.  Pertinent labs & imaging results that were available during my care of the patient were reviewed by me and considered in my medical decision making (see chart for details).    Martha Gomez is a 41 y.o. female who presents to ED for dog bite. Wounds minimal  with bleeding controlled. Irrigated and dressed in ED. Home wound care instructions and prophylactic ABX discussed. Signs of infection / return precautions discussed. Tetanus updated. All questions answered.   Final Clinical Impressions(s) / ED Diagnoses   Final diagnoses:  Dog bite, initial encounter    New Prescriptions New Prescriptions   CEFUROXIME (CEFTIN) 500 MG TABLET    Take 1 tablet (500 mg total) by mouth 2 (two) times daily with a meal.   METRONIDAZOLE (FLAGYL) 500 MG TABLET    Take 1 tablet (500 mg total) by mouth 3 (three) times daily.   MUPIROCIN OINTMENT (BACTROBAN) 2 %    Apply to affected area twice daily.   I personally performed the  services described in this documentation, which was scribed in my presence. The recorded information has been reviewed and is accurate.    [facility] Madina Galati, PA-C 04/23/17 1758    Tilden Fossa, MD 04/23/17 [phone]

## 2017-04-23 NOTE — ED Triage Notes (Signed)
Dog bite to face by own dog. Pt has a place to forehead and to R cheek, bleeding controlled. Animals shots UTD.

## 2017-04-23 NOTE — Discharge Instructions (Signed)
It was my pleasure taking care of you today!   Keep wound clean with mild soap and water. Keep area covered with a topical antibiotic ointment and bandage. Ice for additional pain relief. Ibuprofen or Tylenol for additional pain relief. Monitor area for signs of infection to include, but not limited to: increasing pain, spreading redness, drainage/pus, worsening swelling, or fevers. Return to emergency department for emergent changing or worsening symptoms.  Please take all of your antibiotics until finished!

## 2017-06-09 IMAGING — DX DG KNEE COMPLETE 4+V*L*
4 series · 4 of 4 positions shown · non-contrast
Comparison: None.

CLINICAL DATA: Left knee pain.  Multiple falls this evening.

EXAM:
LEFT KNEE - COMPLETE 4+ VIEW

[knee ap]
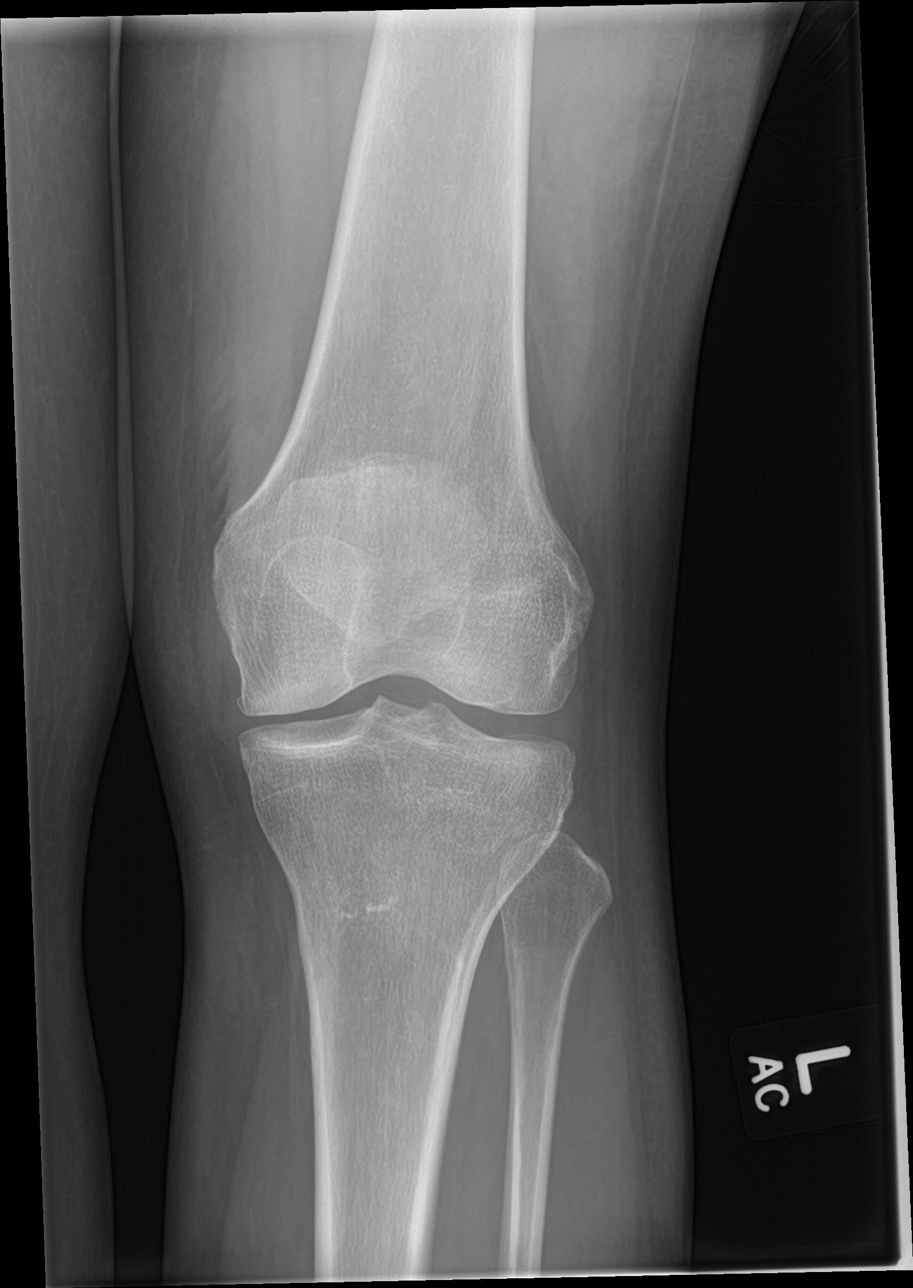

[knee lat]
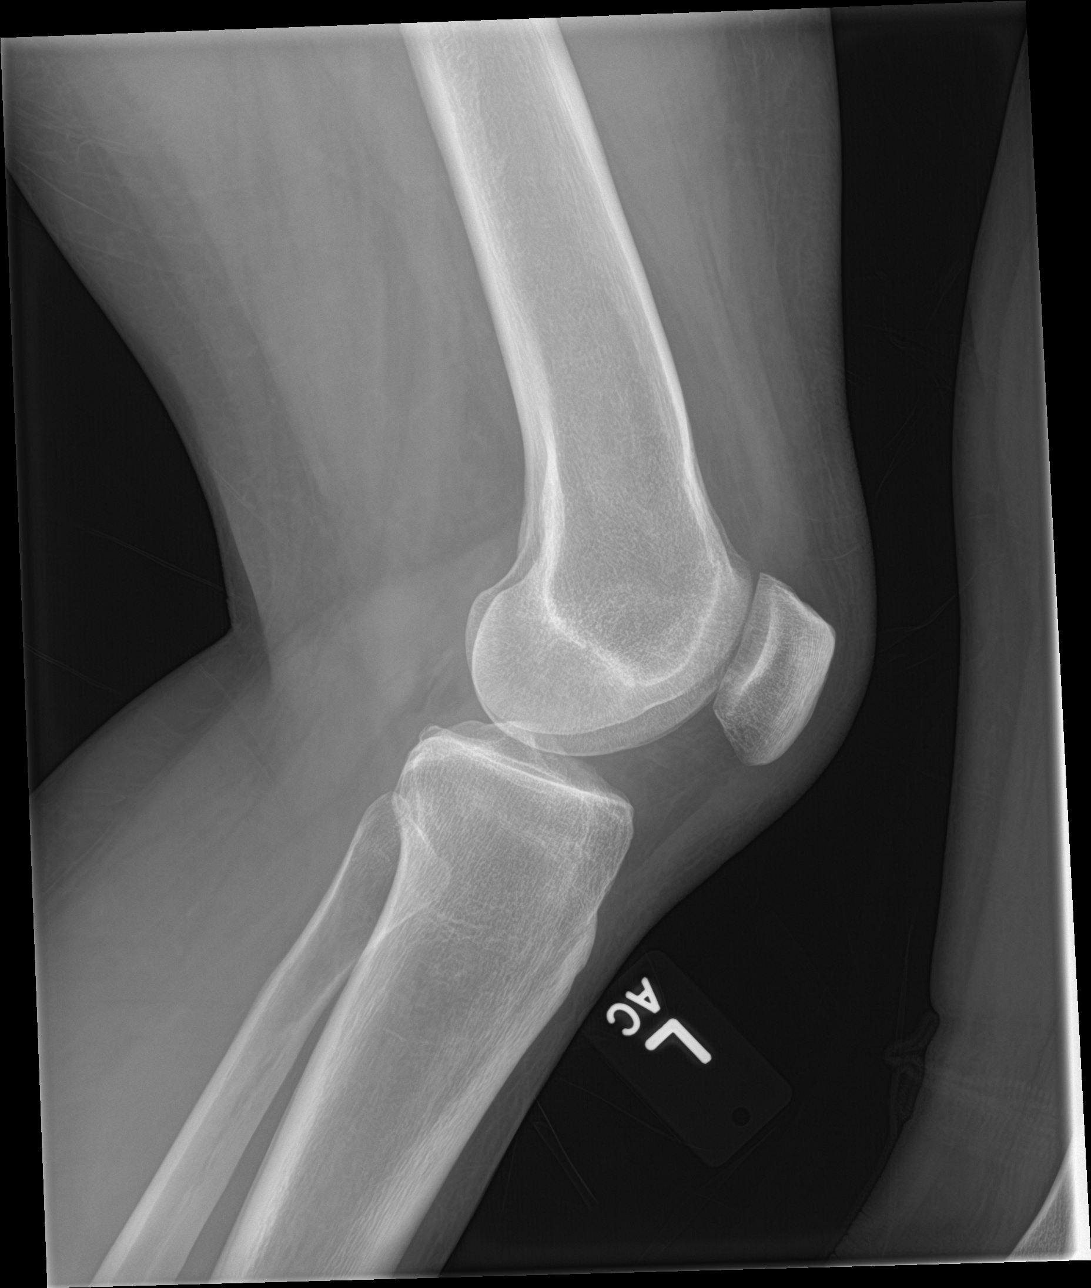

[knee obl (1 of 2)]
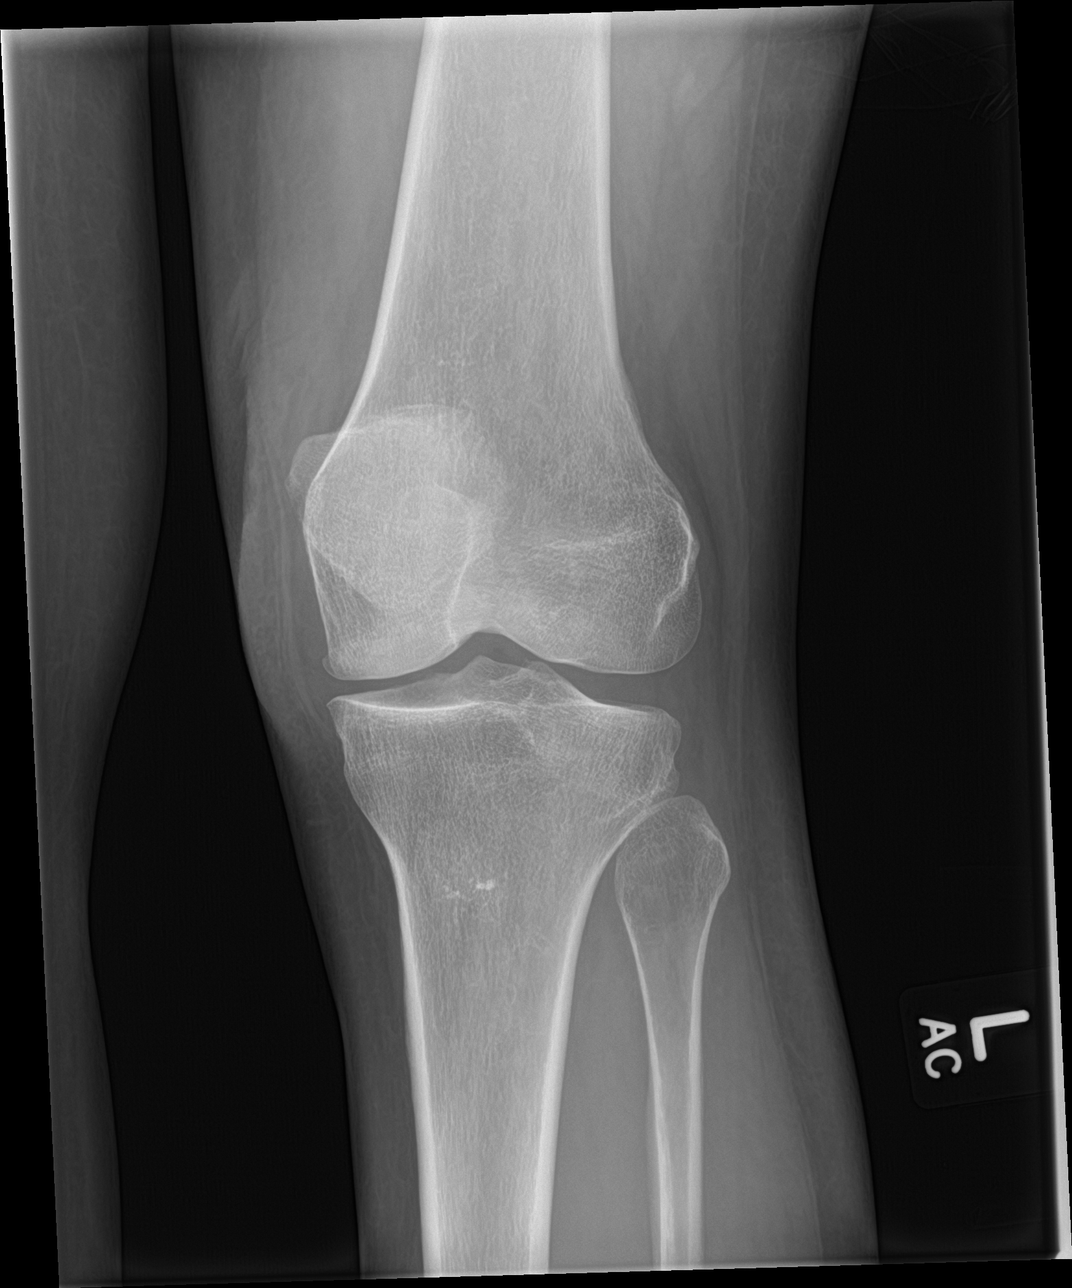

[knee obl (2 of 2)]
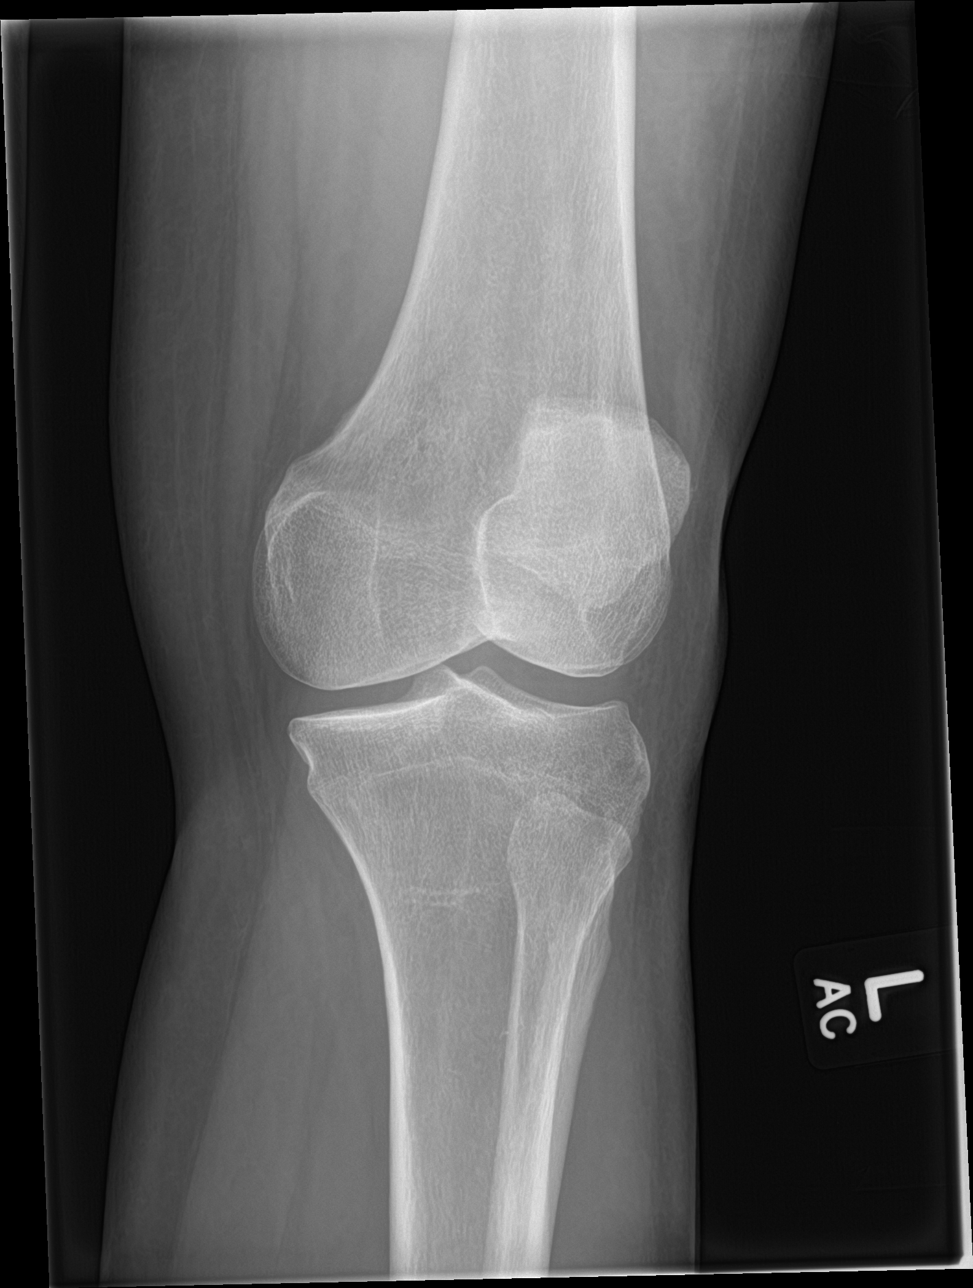

[4 of 4 positions shown; findings below may reference images not displayed]

FINDINGS: No evidence of fracture, dislocation, or joint effusion. Mild
peripheral spurring of the medial tibiofemoral compartment with
preservation of joint space. Soft tissues are unremarkable.
IMPRESSION: No fracture or subluxation of the left knee.

## 2018-06-02 ENCOUNTER — Encounter (HOSPITAL_BASED_OUTPATIENT_CLINIC_OR_DEPARTMENT_OTHER): Payer: Self-pay | Admitting: *Deleted

## 2018-06-02 ENCOUNTER — Other Ambulatory Visit: Payer: Self-pay

## 2018-06-02 ENCOUNTER — Emergency Department (HOSPITAL_BASED_OUTPATIENT_CLINIC_OR_DEPARTMENT_OTHER)
Admission: EM | Admit: 2018-06-02 | Discharge: 2018-06-02 | Disposition: A | Discharge status: Home / Self Care | Payer: Medicaid Other | Attending: Emergency Medicine | Admitting: Emergency Medicine

## 2018-06-02 DIAGNOSIS — Z79899 Other long term (current) drug therapy: Secondary | ICD-10-CM | POA: Diagnosis not present

## 2018-06-02 DIAGNOSIS — R21 Rash and other nonspecific skin eruption: Secondary | ICD-10-CM | POA: Diagnosis present

## 2018-06-02 DIAGNOSIS — F17210 Nicotine dependence, cigarettes, uncomplicated: Secondary | ICD-10-CM | POA: Insufficient documentation

## 2018-06-02 MED ORDER — HYDROXYZINE HCL 25 MG PO TABS
25.0000 mg | ORAL_TABLET | Freq: Once | ORAL | Status: AC
  Administered 2018-06-02: 25 mg via ORAL
  Filled 2018-06-02: qty 1

## 2018-06-02 MED ORDER — PERMETHRIN 5 % EX CREA: TOPICAL_CREAM | CUTANEOUS | 0 refills | Status: DC

## 2018-06-02 MED ORDER — HYDROXYZINE HCL 25 MG PO TABS: 25.0000 mg | ORAL_TABLET | Freq: Four times a day (QID) | ORAL | 0 refills | Status: AC
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
## 2018-06-02 NOTE — ED Provider Notes (Signed)
[facility] EMERGENCY DEPARTMENT Provider Note   CSN: [id number] Arrival date & time: 06/02/18  0214     History   Chief Complaint Chief Complaint  Patient presents with  . open sores on arms.    HPI Martha Gomez is a 42 y.o. female.  HPI  This is a 42 year old female with a history of drug abuse, endometriosis who presents with rash in the bilateral forearms.  Patient reports that she was treating a tree in her backyard for possible mites when "a bunch of bugs fell out of the tree onto her arms."  She reports that they burrowed into her skin and have caused burning and itching.  This happened yesterday.  She states that she has pinched some of the wounds to "get the bugs out."  She describes the bugs as small cylindrical shaped with wings.  She has not taken anything for her symptoms.  She is never had anything like this before.  She reports burning pain in her bilateral hands.  Symptoms are limited to the bilateral upper extremities.  Past Medical History:  Diagnosis Date  . Drug abuse and dependence (HCC)   . Endometriosis   . Herniated disc, cervical   . Marijuana abuse   . Opiate abuse, continuous [facility])     Patient Active Problem List   Diagnosis Date Noted  . DEPRESSION, MAJOR, RECURRENT 02/27/2007  . ANXIETY 02/27/2007    Past Surgical History:  Procedure Laterality Date  . APPENDECTOMY    . TONSILLECTOMY       OB History   None      Home Medications    Prior to Admission medications   Medication Sig Start Date End Date Taking? Authorizing Provider  tiZANidine HCl (ZANAFLEX PO) Take by mouth.   Yes [provider]  carisoprodol (SOMA) 350 MG tablet Take 350 mg by mouth 3 (three) times daily.    [provider]  cefUROXime (CEFTIN) 500 MG tablet Take 1 tablet (500 mg total) by mouth 2 (two) times daily with a meal. 04/23/17   Ward, Chase Picket, PA-C  cephALEXin (KEFLEX) 500 MG capsule Take 1 capsule (500 mg total) by mouth  4 (four) times daily. 01/30/17   Cathren Laine, MD  Coenzyme Q10 (CO Q-10) 200 MG CAPS Take 1 tablet by mouth daily.    [provider]  escitalopram (LEXAPRO) 10 MG tablet Take 10 mg by mouth daily.    [provider]  gabapentin (NEURONTIN) 300 MG capsule Take 400 mg by mouth 3 (three) times daily.     [provider]  hydrOXYzine (ATARAX/VISTARIL) 25 MG tablet Take 1 tablet (25 mg total) by mouth every 6 (six) hours. 06/02/18   Saw Mendenhall, Mayer Masker, MD  linaclotide (LINZESS) 290 MCG CAPS capsule Take 290 mcg by mouth daily before breakfast.    [provider]  Magnesium 400 MG CAPS Take 400 mg by mouth daily.    [provider]  metroNIDAZOLE (FLAGYL) 500 MG tablet Take 1 tablet (500 mg total) by mouth 3 (three) times daily. 04/23/17   Ward, Chase Picket, PA-C  Multiple Vitamin (MULTIVITAMIN) tablet Take 1 tablet by mouth daily.    [provider]  mupirocin ointment (BACTROBAN) 2 % Apply to affected area twice daily. 04/23/17   Ward, Chase Picket, PA-C  permethrin (ELIMITE) 5 % cream Apply to affected area once Leave on for 6 to 8 hours and then wash off.  Do not apply to face. 06/02/18  Nastassia Bazaldua, Mayer Maskerourtney F, MD  promethazine (PHENERGAN) 25 MG tablet Take 25 mg by mouth every 6 (six) hours as needed for nausea or vomiting.    [provider]  RABEprazole (ACIPHEX) 20 MG tablet Take 20 mg by mouth 2 (two) times daily.    [provider]  topiramate (TOPAMAX) 50 MG tablet Take 75 mg by mouth 2 (two) times daily.    [provider]  vitamin C (ASCORBIC ACID) 500 MG tablet Take 500 mg by mouth daily.    [provider]    Family History No family history on file.  Social History Social History   Tobacco Use  . Smoking status: Current Every Day Smoker    Packs/day: 1.00    Types: Cigarettes  . Smokeless tobacco: Never Used  Substance Use Topics  . Alcohol use: No  . Drug use: Yes    Types: Marijuana     Comment: opiates, prescripton gabapentin, muscle relaxants     Allergies   Celebrex [celecoxib]; Cymbalta [duloxetine hcl]; Erythromycin; Nsaids; Penicillins; and Sulfa antibiotics   Review of Systems Review of Systems  Constitutional: Negative for fever.  Respiratory: Negative for shortness of breath.   Cardiovascular: Negative for chest pain.  Skin: Positive for rash. Negative for color change.  All other systems reviewed and are negative.    Physical Exam Updated Vital Signs BP (!) 133/95 (BP Location: Left Arm)   Pulse 91   Temp 98.2 F (36.8 C) (Oral)   Resp 20   Ht 5\' 6"  (1.676 m)   Wt 67.1 kg (148 lb)   LMP 12/31/2017 (Approximate) Comment: irregular periods   SpO2 100%   BMI 23.89 kg/m   Physical Exam  Constitutional: She is oriented to person, place, and time.  Anxious appearing but nontoxic  HENT:  Head: Normocephalic and atraumatic.  Neck: Neck supple.  Cardiovascular: Normal rate and regular rhythm.  Pulmonary/Chest: Effort normal. No respiratory distress.  Musculoskeletal: She exhibits no edema or tenderness.  Neurological: She is alert and oriented to person, place, and time.  Skin: Skin is warm and dry.  Multiple excoriations over the bilateral forearms and hands mostly on the dorsum of the hands, no pustules noted, no significant erythema, no obvious parasite  Psychiatric: She has a normal mood and affect.  Nursing note and vitals reviewed.    ED Treatments / Results  Labs (all labs ordered are listed, but only abnormal results are displayed) Labs Reviewed - No data to display  EKG None  Radiology No results found.  Procedures Procedures (including critical care time)  Medications Ordered in ED Medications  hydrOXYzine (ATARAX/VISTARIL) tablet 25 mg (has no administration in time range)     Initial Impression / Assessment and Plan / ED Course  I have reviewed the triage vital signs and the nursing notes.  Pertinent labs & imaging  results that were available during my care of the patient were reviewed by me and considered in my medical decision making (see chart for details).     Patient presents with concerns for infestation bilateral upper extremities.  There is no clinical evidence of any infestation at this time.  She has excoriations mostly over the dorsum of the bilateral hands and forearms.  No significant erythema or evidence of infection.  She does have some excoriation in the web spacing which could reflect scabies.  However, this does not fit the patient's story.  She appears very anxious regarding her symptoms.  Question psychogenic component.  However,  given acute symptoms, will treat with permethrin and Atarax.  Have patient follow-up with her primary physician for recheck.  After history, exam, and medical workup I feel the patient has been appropriately medically screened and is safe for discharge home. Pertinent diagnoses were discussed with the patient. Patient was given return precautions.   Final Clinical Impressions(s) / ED Diagnoses   Final diagnoses:  Rash    ED Discharge Orders        Ordered    hydrOXYzine (ATARAX/VISTARIL) 25 MG tablet  Every 6 hours     06/02/18 0356    permethrin (ELIMITE) 5 % cream     06/02/18 0356       Gerianne Simonet, Mayer Masker, MD 06/02/18 0400

## 2018-06-02 NOTE — Discharge Instructions (Addendum)
You were seen today with concerns for a rash caused by some sort of bug.  You will be treated with a cream.  Atarax for itching.  Follow-up with your primary physician if not improving.

## 2018-06-03 ENCOUNTER — Other Ambulatory Visit: Payer: Self-pay

## 2018-06-03 ENCOUNTER — Encounter (HOSPITAL_BASED_OUTPATIENT_CLINIC_OR_DEPARTMENT_OTHER): Payer: Self-pay | Admitting: Adult Health

## 2018-06-03 DIAGNOSIS — Z79899 Other long term (current) drug therapy: Secondary | ICD-10-CM | POA: Insufficient documentation

## 2018-06-03 DIAGNOSIS — R21 Rash and other nonspecific skin eruption: Secondary | ICD-10-CM | POA: Diagnosis present

## 2018-06-03 DIAGNOSIS — F1110 Opioid abuse, uncomplicated: Secondary | ICD-10-CM | POA: Diagnosis not present

## 2018-06-03 DIAGNOSIS — F17210 Nicotine dependence, cigarettes, uncomplicated: Secondary | ICD-10-CM | POA: Diagnosis not present

## 2018-06-03 DIAGNOSIS — F1210 Cannabis abuse, uncomplicated: Secondary | ICD-10-CM | POA: Insufficient documentation

## 2018-06-03 NOTE — ED Triage Notes (Signed)
Pt states, "I was here yesterday and got a cream for my skin. I have worms coming out of my skin. I brought a part of one with me. At night you can see hem crawling through my arms. They poke of my skin and make holes and it feels like little tiny needles poking through my skin"

## 2018-06-04 ENCOUNTER — Emergency Department (HOSPITAL_BASED_OUTPATIENT_CLINIC_OR_DEPARTMENT_OTHER)
Admission: EM | Admit: 2018-06-04 | Discharge: 2018-06-04 | Disposition: A | Discharge status: Home / Self Care | Payer: Medicaid Other | Attending: Emergency Medicine | Admitting: Emergency Medicine

## 2018-06-04 DIAGNOSIS — R21 Rash and other nonspecific skin eruption: Secondary | ICD-10-CM

## 2018-06-04 MED ORDER — PERMETHRIN 5 % EX CREA: TOPICAL_CREAM | CUTANEOUS | 0 refills | Status: AC

## 2018-06-04 NOTE — ED Provider Notes (Signed)
[facility] Provider Note: [name], MD, FACEP  CSN: [id number] MRN: [id number] ARRIVAL: 6/4/19[name] at 2313 ROOM: [facility]1/[facility]1   CHIEF COMPLAINT  Rash   HISTORY OF PRESENT ILLNESS  06/04/18 3:34 AM Martha Gomez is a 42 y.o. female with a history of polysubstance abuse who was seen the day before yesterday complaining of a rash.  She believed she was infested with mites which fell out of a tree onto her arms.  She believes that they had burrowed into her skin causing burning and itching.  She described the bugs as small, cylindrical shaped, with wings.  The physician evaluating her did not see any parasites but did see multiple excoriations.  She was prescribed permethrin,  which she states she used.  She was also prescribed hydroxyzine but has not been using this because she is not itching significantly.  She returns complaining of still seeing bugs crawling out of her skin after working outside again yesterday.  She denies any other new symptoms.  Past Medical History:  Diagnosis Date  . Drug abuse and dependence (HCC)   . Endometriosis   . Herniated disc, cervical   . Marijuana abuse   . Opiate abuse, continuous (HCC)     Past Surgical History:  Procedure Laterality Date  . APPENDECTOMY    . TONSILLECTOMY      History reviewed. No pertinent family history.  Social History   Tobacco Use  . Smoking status: Current Every Day Smoker    Packs/day: 1.00    Types: Cigarettes  . Smokeless tobacco: Never Used  Substance Use Topics  . Alcohol use: No  . Drug use: Yes    Types: Marijuana    Comment: opiates, prescripton gabapentin, muscle relaxants    Prior to Admission medications   Medication Sig Start Date End Date Taking? Authorizing Provider  carisoprodol (SOMA) 350 MG tablet Take 350 mg by mouth 3 (three) times daily.    [provider]  cefUROXime (CEFTIN) 500 MG tablet Take 1 tablet (500 mg total) by mouth 2 (two) times daily with a meal.  04/23/17   Ward, Chase PicketJaime Pilcher, PA-C  cephALEXin (KEFLEX) 500 MG capsule Take 1 capsule (500 mg total) by mouth 4 (four) times daily. 01/30/17   Cathren LaineSteinl, Kevin, MD  Coenzyme Q10 (CO Q-10) 200 MG CAPS Take 1 tablet by mouth daily.    [provider]  escitalopram (LEXAPRO) 10 MG tablet Take 10 mg by mouth daily.    [provider]  gabapentin (NEURONTIN) 300 MG capsule Take 400 mg by mouth 3 (three) times daily.     [provider]  hydrOXYzine (ATARAX/VISTARIL) 25 MG tablet Take 1 tablet (25 mg total) by mouth every 6 (six) hours. 06/02/18   Horton, Mayer Maskerourtney F, MD  linaclotide (LINZESS) 290 MCG CAPS capsule Take 290 mcg by mouth daily before breakfast.    [provider]  Magnesium 400 MG CAPS Take 400 mg by mouth daily.    [provider]  metroNIDAZOLE (FLAGYL) 500 MG tablet Take 1 tablet (500 mg total) by mouth 3 (three) times daily. 04/23/17   Ward, Chase PicketJaime Pilcher, PA-C  Multiple Vitamin (MULTIVITAMIN) tablet Take 1 tablet by mouth daily.    [provider]  mupirocin ointment (BACTROBAN) 2 % Apply to affected area twice daily. 04/23/17   Ward, Chase PicketJaime Pilcher, PA-C  permethrin (ELIMITE) 5 % cream Apply to affected area once Leave on for 6 to 8 hours and then wash off.  Do not  apply to face. 06/02/18   Horton, Mayer Masker, MD  promethazine (PHENERGAN) 25 MG tablet Take 25 mg by mouth every 6 (six) hours as needed for nausea or vomiting.    [provider]  RABEprazole (ACIPHEX) 20 MG tablet Take 20 mg by mouth 2 (two) times daily.    [provider]  tiZANidine HCl (ZANAFLEX PO) Take by mouth.    [provider]  topiramate (TOPAMAX) 50 MG tablet Take 75 mg by mouth 2 (two) times daily.    [provider]  vitamin C (ASCORBIC ACID) 500 MG tablet Take 500 mg by mouth daily.    [provider]    Allergies Celebrex [celecoxib]; Cymbalta [duloxetine hcl]; Erythromycin; Nsaids; Penicillins; and Sulfa  antibiotics   REVIEW OF SYSTEMS  Negative except as noted here or in the History of Present Illness.   PHYSICAL EXAMINATION  Initial Vital Signs Blood pressure 114/79, pulse 99, temperature 98.1 F (36.7 C), temperature source Oral, resp. rate 18, height 5\' 6"  (1.676 m), weight 67.1 kg (148 lb), SpO2 99 %.  Examination General: Well-developed, well-nourished female in no acute distress; appears older than age of record HENT: normocephalic; atraumatic Eyes: pupils equal, round and reactive to light; extraocular muscles intact Neck: supple Heart: regular rate and rhythm Lungs: clear to auscultation bilaterally Abdomen: soft; nondistended Extremities: No deformity; full range of motion; pulses normal Neurologic: Awake, alert and oriented; motor function intact in all extremities and symmetric; no facial droop Skin: Warm and dry; multiple excoriations; no parasites seen; alleged parasite brought in plastic bag appears to be a scab Psychiatric: Flat affect   RESULTS  Summary of this visit's results, reviewed by myself:   EKG Interpretation  Date/Time:    Ventricular Rate:    PR Interval:    QRS Duration:   QT Interval:    QTC Calculation:   R Axis:     Text Interpretation:        Laboratory Studies: No results found for this or any previous visit (from the past 24 hour(s)). Imaging Studies: No results found.  ED COURSE and MDM  Nursing notes and initial vitals signs, including pulse oximetry, reviewed.  Vitals:   06/03/18 2320 06/03/18 2321  BP: 114/79   Pulse: 99   Resp: 18   Temp: 98.1 F (36.7 C)   TempSrc: Oral   SpO2: 99%   Weight:  67.1 kg (148 lb)  Height:  5\' 6"  (1.676 m)   As discussed on previous visit this does not appear to be an infestation.  A psychogenic component is suspected.  She was however treated for possible scabies or other mite infestation.  She was advised that retreatment is not indicated at this time but may be indicated at 2 weeks  if symptoms persist or return.  PROCEDURES    ED DIAGNOSES     ICD-10-CM   1. Rash R21        Adrean Heitz, Jonny Ruiz, MD 06/04/18 [id number]

## 2018-06-12 ENCOUNTER — Other Ambulatory Visit: Payer: Self-pay

## 2018-06-12 ENCOUNTER — Encounter (HOSPITAL_BASED_OUTPATIENT_CLINIC_OR_DEPARTMENT_OTHER): Payer: Self-pay | Admitting: *Deleted

## 2018-06-12 ENCOUNTER — Emergency Department (HOSPITAL_BASED_OUTPATIENT_CLINIC_OR_DEPARTMENT_OTHER)
Admission: EM | Admit: 2018-06-12 | Discharge: 2018-06-12 | Disposition: A | Discharge status: Home / Self Care | Payer: Medicaid Other | Attending: Emergency Medicine | Admitting: Emergency Medicine

## 2018-06-12 DIAGNOSIS — F17210 Nicotine dependence, cigarettes, uncomplicated: Secondary | ICD-10-CM | POA: Diagnosis not present

## 2018-06-12 DIAGNOSIS — Z5329 Procedure and treatment not carried out because of patient's decision for other reasons: Secondary | ICD-10-CM | POA: Insufficient documentation

## 2018-06-12 DIAGNOSIS — Z79899 Other long term (current) drug therapy: Secondary | ICD-10-CM | POA: Insufficient documentation

## 2018-06-12 DIAGNOSIS — R21 Rash and other nonspecific skin eruption: Secondary | ICD-10-CM | POA: Diagnosis present

## 2018-06-12 DIAGNOSIS — R451 Restlessness and agitation: Secondary | ICD-10-CM | POA: Insufficient documentation

## 2018-06-12 DIAGNOSIS — R441 Visual hallucinations: Secondary | ICD-10-CM | POA: Diagnosis not present

## 2018-06-12 NOTE — ED Notes (Signed)
Patient left AMA and per registration, she was upset and her daughter was crying.

## 2018-06-12 NOTE — ED Notes (Signed)
ED Provider at bedside. 

## 2018-06-12 NOTE — ED Notes (Addendum)
EDP and me checked  patient's skin meticulously to look for the "whitish-grayish worm" on her skin. Small skin marks noted to her left dorsal hand and per patient, she used the puller trying to catch the worm.  Informed patient, that we did not see any worm.  I asked patient's daughter if she saw any worm and she shook her head.  She asked the EDP if she can talk to her alone.

## 2018-06-12 NOTE — ED Triage Notes (Signed)
States she has worms coming out of her skin. She has been here multiple times with same complain. No worms have been seen.

## 2018-06-12 NOTE — ED Provider Notes (Signed)
[facility] EMERGENCY DEPARTMENT Provider Note   CSN: [id number] Arrival date & time: 06/12/18  1551     History   Chief Complaint Chief Complaint  Patient presents with  . Rash    HPI Martha Gomez is a 42 y.o. female with a h/o of migraines, bipolar disorder, PTSD, depression, drug abuse, and endometriosis who presents to the emergency department with a chief complaint of rash to the bilateral hands and forearms.  She reports that 2 weeks ago that she was treating a tree in her yard when bugs fell out of the tree and all over her.  She states "they have been coming on my fingernails and earlier today and so when coming out of my nostril."  Symptoms have been worsening since onset.  She also reports a stinging sensation all over her body.  She has been evaluated for this problem 2 previous times in the last 10 days.  She was prescribed permethrin, which she has completed with no improvement in her symptoms.  Accompanied by her teenage daughter today who has asked to speak to PA student Maisie Fushomas who I am precepting today privately.  The patient's daughter states that she has not seen any bugs crawling out of her mother's skin over the last 2 weeks since the rash began.  She also reports that the patient has been complaining of seeing animals running around the yard.  She reports they found a dead rabbit in the yard 2 weeks prior, which may have caused her symptoms to worsen.  The patient's daughter also reports that this time of year is the anniversary of the patient's fianc after he committed suicide in 2017.  The patient's daughter expresses an immense amount of concern about the increasing erratic behavior at that the patient has been having.  He denies fever, chills, chest pain, shortness of breath.  No new soaps, lotions, detergents, weakness, numbness, myalgias.  No recent tick bites.  The patient is actively picking at 1 of her fingers, which is started to bleed, continuing  to frantically discuss the bugs coming out of her skin.  No new medications or recent medication adjustments.  During history taking, I asked the patient home she has been feeling aside from the rash over the last 2 weeks.  She states that she has been under more stress, but has no other complaints.  She denies SI or HI.  I asked the patient how I can help her today.  She states that she just wants to rash to stop.  I begins as the patient about her mood and other symptoms that may be contributing to her presenting symptoms, and she becomes upset, collects her purse, and leaves the ED.  The patient's daughter begins to cry.  The patient states " I will just follow up with my regular doctor."  The history is provided by the patient. No language interpreter was used.    Past Medical History:  Diagnosis Date  . Drug abuse and dependence (HCC)   . Endometriosis   . Herniated disc, cervical   . Marijuana abuse   . Opiate abuse, continuous [facility](HCC)     Patient Active Problem List   Diagnosis Date Noted  . DEPRESSION, MAJOR, RECURRENT 02/27/2007  . ANXIETY 02/27/2007    Past Surgical History:  Procedure Laterality Date  . APPENDECTOMY    . TONSILLECTOMY       OB History   None      Home Medications  Prior to Admission medications   Medication Sig Start Date End Date Taking? Authorizing Provider  Chlorzoxazone (LORZONE) 750 MG TABS Take 750 mg by mouth 3 (three) times daily.   Yes [provider]  DESIPRAMINE HCL PO Take 50 mg by mouth.   Yes [provider]  linaclotide (LINZESS) 290 MCG CAPS capsule Take 290 mcg by mouth daily before breakfast.   Yes [provider]  Multiple Vitamin (MULTIVITAMIN) tablet Take 1 tablet by mouth daily.   Yes [provider]  mupirocin ointment (BACTROBAN) 2 % Apply to affected area twice daily. 04/23/17  Yes Ward, Chase Picket, PA-C  permethrin (ELIMITE) 5 % cream Reapply in 2 weeks if symptoms persist. Leave  on for 6 to 8 hours and then wash off.  Do not apply to face. 06/04/18  Yes Molpus, John, MD  RABEprazole (ACIPHEX) 20 MG tablet Take 20 mg by mouth 2 (two) times daily.   Yes [provider]  tiZANidine HCl (ZANAFLEX PO) Take 4 mg by mouth 3 (three) times daily.    Yes [provider]  carisoprodol (SOMA) 350 MG tablet Take 350 mg by mouth 3 (three) times daily.    [provider]  cefUROXime (CEFTIN) 500 MG tablet Take 1 tablet (500 mg total) by mouth 2 (two) times daily with a meal. 04/23/17   Ward, Chase Picket, PA-C  cephALEXin (KEFLEX) 500 MG capsule Take 1 capsule (500 mg total) by mouth 4 (four) times daily. 01/30/17   Cathren Laine, MD  Coenzyme Q10 (CO Q-10) 200 MG CAPS Take 1 tablet by mouth daily.    [provider]  escitalopram (LEXAPRO) 10 MG tablet Take 10 mg by mouth daily.    [provider]  gabapentin (NEURONTIN) 300 MG capsule Take 400 mg by mouth 4 (four) times daily.     [provider]  hydrOXYzine (ATARAX/VISTARIL) 25 MG tablet Take 1 tablet (25 mg total) by mouth every 6 (six) hours. 06/02/18   Horton, Mayer Masker, MD  Magnesium 400 MG CAPS Take 400 mg by mouth daily.    [provider]  metroNIDAZOLE (FLAGYL) 500 MG tablet Take 1 tablet (500 mg total) by mouth 3 (three) times daily. 04/23/17   Ward, Chase Picket, PA-C  promethazine (PHENERGAN) 25 MG tablet Take 25 mg by mouth every 6 (six) hours as needed for nausea or vomiting.    [provider]  topiramate (TOPAMAX) 50 MG tablet Take 75 mg by mouth 2 (two) times daily.    [provider]  vitamin C (ASCORBIC ACID) 500 MG tablet Take 500 mg by mouth daily.    [provider]    Family History No family history on file.  Social History Social History   Tobacco Use  . Smoking status: Current Every Day Smoker    Packs/day: 1.00    Types: Cigarettes  . Smokeless tobacco: Never Used  Substance Use Topics  . Alcohol use: No  . Drug  use: Yes    Types: Marijuana    Comment: opiates, prescripton gabapentin, muscle relaxants     Allergies   Celebrex [celecoxib]; Cymbalta [duloxetine hcl]; Erythromycin; Nsaids; Penicillins; and Sulfa antibiotics   Review of Systems Review of Systems  Constitutional: Negative for activity change.  Respiratory: Negative for shortness of breath.   Cardiovascular: Negative for chest pain.  Gastrointestinal: Negative for abdominal pain.  Genitourinary: Negative for dysuria.  Musculoskeletal: Negative for back pain.  Skin: Positive for rash.  Allergic/Immunologic: Negative for immunocompromised  state.  Neurological: Negative for headaches.  Psychiatric/Behavioral: Negative for confusion.     Physical Exam Updated Vital Signs BP (!) 130/92   Pulse 94   Temp 98.4 F (36.9 C) (Oral)   Resp 18   Ht 5\' 6"  (1.676 m)   Wt 67.1 kg (148 lb)   SpO2 100%   BMI 23.89 kg/m   Physical Exam  Constitutional: No distress.  Poor eye contact.  Pressured speech.  HENT:  Head: Normocephalic.  Eyes: Conjunctivae are normal.  Neck: Neck supple.  Cardiovascular: Normal rate and regular rhythm. Exam reveals no gallop and no friction rub.  No murmur heard. Pulmonary/Chest: Effort normal. No respiratory distress.  Abdominal: Soft. She exhibits no distension.  Musculoskeletal:  Scattered excoriated lesions noted to the bilateral forearms and dorsum of the bilateral hands.  There is a superficial wound to the to the left index finger the patient is actively picking that is bleeding.  Bandages are present on other fingers of the bilateral hands.  No lesions noted to the back.  Neurological: She is alert.  Skin: Skin is warm. No rash noted.  Psychiatric: Her mood appears anxious. Her speech is rapid and/or pressured. She is agitated, hyperactive and withdrawn. She is not combative. Thought content is delusional. She expresses impulsivity. She expresses no homicidal and no suicidal ideation. She  is inattentive.  Nursing note and vitals reviewed.    ED Treatments / Results  Labs (all labs ordered are listed, but only abnormal results are displayed) Labs Reviewed - No data to display  EKG None  Radiology No results found.  Procedures Procedures (including critical care time)  Medications Ordered in ED Medications - No data to display   Initial Impression / Assessment and Plan / ED Course  I have reviewed the triage vital signs and the nursing notes.  Pertinent labs & imaging results that were available during my care of the patient were reviewed by me and considered in my medical decision making (see chart for details).     42 year old with a h/o of migraines, bipolar disorder, PTSD, depression, drug abuse, and endometriosis who presents to the emergency department with a chief complaint of rash.  The patient thinks that that she has an infestation of bugs to the bilateral upper extremities that continues to spread.  On exam, she has excoriations over the bilateral upper extremities.  Multiple bandages are seen on the bilateral fingers.  She is actively picking at her left index finger, which is started to bleed.    She has been seen and evaluated for the same symptoms 2 previous times in the last 10 days.  Concern for psychogenic pain component was expressed during the 2 previous visits.  She was treated with permethrin, which she has completed, with no improvement in her symptoms.   Today, the patient's daughter is with her, who expresses concern that the patient is having visual hallucinations.  Denies SI and HI at this time.  When I start to ask the patient about how she is otherwise doing aside from thinking that bugs are crawling out of her skin and nose, she becomes upset and left AMA from the ED.  He states that she will follow-up with her regular PCP.  Spoke with Dr. Deretha Emory, attending physician, who is aware that the patient has left AMA.   Final Clinical  Impressions(s) / ED Diagnoses   Final diagnoses:  Visual hallucinations    ED Discharge Orders    None  Frederik Pear A, PA-C 06/12/18 1743    Vanetta Mulders, MD 06/13/18 [phone]

## 2018-07-29 ENCOUNTER — Emergency Department (HOSPITAL_BASED_OUTPATIENT_CLINIC_OR_DEPARTMENT_OTHER)
Admission: EM | Admit: 2018-07-29 | Discharge: 2018-07-29 | Discharge status: Against Medical Advice | Payer: Medicaid Other
